# Patient Record
Sex: Female | Born: 1935 | Race: Black or African American | Hispanic: No
Health system: Southern US, Community
[De-identification: ages and names within clinical notes are randomized; demographics above are authoritative.]

## PROBLEM LIST (undated history)

## (undated) DIAGNOSIS — F0390 Unspecified dementia without behavioral disturbance: Secondary | ICD-10-CM

---

## 2017-04-10 DIAGNOSIS — R600 Localized edema: Secondary | ICD-10-CM | POA: Diagnosis not present

## 2017-04-10 DIAGNOSIS — I10 Essential (primary) hypertension: Secondary | ICD-10-CM | POA: Diagnosis not present

## 2017-04-10 DIAGNOSIS — E785 Hyperlipidemia, unspecified: Secondary | ICD-10-CM | POA: Diagnosis not present

## 2017-04-10 DIAGNOSIS — I214 Non-ST elevation (NSTEMI) myocardial infarction: Secondary | ICD-10-CM | POA: Diagnosis not present

## 2017-04-10 DIAGNOSIS — E876 Hypokalemia: Secondary | ICD-10-CM | POA: Diagnosis not present

## 2017-04-10 DIAGNOSIS — E279 Disorder of adrenal gland, unspecified: Secondary | ICD-10-CM

## 2017-04-10 DIAGNOSIS — H409 Unspecified glaucoma: Secondary | ICD-10-CM | POA: Diagnosis not present

## 2017-04-11 DIAGNOSIS — H409 Unspecified glaucoma: Secondary | ICD-10-CM | POA: Diagnosis not present

## 2017-04-11 DIAGNOSIS — E876 Hypokalemia: Secondary | ICD-10-CM | POA: Diagnosis not present

## 2017-04-11 DIAGNOSIS — I10 Essential (primary) hypertension: Secondary | ICD-10-CM | POA: Diagnosis not present

## 2017-04-11 DIAGNOSIS — R600 Localized edema: Secondary | ICD-10-CM | POA: Diagnosis not present

## 2017-04-11 DIAGNOSIS — I214 Non-ST elevation (NSTEMI) myocardial infarction: Secondary | ICD-10-CM | POA: Diagnosis not present

## 2017-04-11 DIAGNOSIS — E785 Hyperlipidemia, unspecified: Secondary | ICD-10-CM

## 2017-04-11 DIAGNOSIS — E279 Disorder of adrenal gland, unspecified: Secondary | ICD-10-CM | POA: Diagnosis not present

## 2017-10-14 ENCOUNTER — Other Ambulatory Visit: Payer: Self-pay

## 2017-10-14 ENCOUNTER — Emergency Department (HOSPITAL_COMMUNITY): Payer: Medicare Other

## 2017-10-14 ENCOUNTER — Emergency Department (HOSPITAL_COMMUNITY)
Admission: EM | Admit: 2017-10-14 | Discharge: 2017-10-18 | Payer: Medicare Other | Attending: Emergency Medicine | Admitting: Emergency Medicine

## 2017-10-14 ENCOUNTER — Encounter (HOSPITAL_COMMUNITY): Payer: Self-pay

## 2017-10-14 ENCOUNTER — Ambulatory Visit (HOSPITAL_COMMUNITY)
Admission: RE | Admit: 2017-10-14 | Discharge: 2017-10-14 | Disposition: A | Discharge status: Home / Self Care | Payer: Medicare Other | Source: Home / Self Care | Attending: Psychiatry | Admitting: Psychiatry

## 2017-10-14 DIAGNOSIS — Z79899 Other long term (current) drug therapy: Secondary | ICD-10-CM | POA: Insufficient documentation

## 2017-10-14 DIAGNOSIS — F0391 Unspecified dementia with behavioral disturbance: Secondary | ICD-10-CM | POA: Insufficient documentation

## 2017-10-14 DIAGNOSIS — G3184 Mild cognitive impairment, so stated: Secondary | ICD-10-CM

## 2017-10-14 DIAGNOSIS — R4689 Other symptoms and signs involving appearance and behavior: Secondary | ICD-10-CM

## 2017-10-14 DIAGNOSIS — R4182 Altered mental status, unspecified: Secondary | ICD-10-CM | POA: Diagnosis present

## 2017-10-14 DIAGNOSIS — F03918 Unspecified dementia, unspecified severity, with other behavioral disturbance: Secondary | ICD-10-CM | POA: Diagnosis present

## 2017-10-14 DIAGNOSIS — R456 Violent behavior: Secondary | ICD-10-CM | POA: Insufficient documentation

## 2017-10-14 DIAGNOSIS — Z008 Encounter for other general examination: Secondary | ICD-10-CM

## 2017-10-14 DIAGNOSIS — R4587 Impulsiveness: Secondary | ICD-10-CM | POA: Diagnosis not present

## 2017-10-14 DIAGNOSIS — R410 Disorientation, unspecified: Secondary | ICD-10-CM

## 2017-10-14 DIAGNOSIS — Z9114 Patient's other noncompliance with medication regimen: Secondary | ICD-10-CM | POA: Diagnosis not present

## 2017-10-14 HISTORY — DX: Unspecified dementia, unspecified severity, without behavioral disturbance, psychotic disturbance, mood disturbance, and anxiety: F03.90

## 2017-10-14 LAB — URINALYSIS, ROUTINE W REFLEX MICROSCOPIC
BILIRUBIN URINE: NEGATIVE
Glucose, UA: NEGATIVE mg/dL
KETONES UR: NEGATIVE mg/dL
LEUKOCYTES UA: NEGATIVE
Nitrite: NEGATIVE
PROTEIN: NEGATIVE mg/dL
Specific Gravity, Urine: 1.015 (ref 1.005–1.030)
pH: 7 (ref 5.0–8.0)

## 2017-10-14 LAB — COMPREHENSIVE METABOLIC PANEL
ALBUMIN: 3.5 g/dL (ref 3.5–5.0)
ALK PHOS: 94 U/L (ref 38–126)
ALT: 17 U/L (ref 14–54)
ANION GAP: 7 (ref 5–15)
AST: 24 U/L (ref 15–41)
BUN: 9 mg/dL (ref 6–20)
CALCIUM: 8.8 mg/dL — AB (ref 8.9–10.3)
CO2: 31 mmol/L (ref 22–32)
Chloride: 106 mmol/L (ref 101–111)
Creatinine, Ser: 1.01 mg/dL — ABNORMAL HIGH (ref 0.44–1.00)
GFR calc Af Amer: 54 mL/min — ABNORMAL LOW (ref >60)
GFR calc non Af Amer: 47 mL/min — ABNORMAL LOW (ref >60)
GLUCOSE: 109 mg/dL — AB (ref 65–99)
Potassium: 3.7 mmol/L (ref 3.5–5.1)
Sodium: 144 mmol/L (ref 135–145)
Total Bilirubin: 0.6 mg/dL (ref 0.3–1.2)
Total Protein: 6.8 g/dL (ref 6.5–8.1)

## 2017-10-14 LAB — CBC WITH DIFFERENTIAL/PLATELET
BASOS ABS: 0 10*3/uL (ref 0.0–0.1)
BASOS PCT: 0 %
Eosinophils Absolute: 0.2 10*3/uL (ref 0.0–0.7)
Eosinophils Relative: 3 %
HCT: 36 % (ref 36.0–46.0)
HEMOGLOBIN: 10.8 g/dL — AB (ref 12.0–15.0)
Lymphocytes Relative: 22 %
Lymphs Abs: 1.3 10*3/uL (ref 0.7–4.0)
MCH: 25.9 pg — ABNORMAL LOW (ref 26.0–34.0)
MCHC: 30 g/dL (ref 30.0–36.0)
MCV: 86.3 fL (ref 78.0–100.0)
Monocytes Absolute: 0.6 10*3/uL (ref 0.1–1.0)
Monocytes Relative: 11 %
NEUTROS PCT: 64 %
Neutro Abs: 3.7 10*3/uL (ref 1.7–7.7)
Platelets: 185 10*3/uL (ref 150–400)
RBC: 4.17 MIL/uL (ref 3.87–5.11)
RDW: 16.8 % — ABNORMAL HIGH (ref 11.5–15.5)
WBC: 5.8 10*3/uL (ref 4.0–10.5)

## 2017-10-14 MED ORDER — CARVEDILOL 6.25 MG PO TABS
6.2500 mg | ORAL_TABLET | Freq: Two times a day (BID) | ORAL | Status: DC
  Administered 2017-10-15 – 2017-10-18 (×3): 6.25 mg via ORAL
  Filled 2017-10-14 (×9): qty 1

## 2017-10-14 MED ORDER — AMLODIPINE BESYLATE 5 MG PO TABS
10.0000 mg | ORAL_TABLET | Freq: Every day | ORAL | Status: DC
  Administered 2017-10-15 – 2017-10-18 (×3): 10 mg via ORAL
  Filled 2017-10-14 (×4): qty 2

## 2017-10-14 MED ORDER — MEMANTINE HCL 10 MG PO TABS
10.0000 mg | ORAL_TABLET | Freq: Two times a day (BID) | ORAL | Status: DC
  Administered 2017-10-15 – 2017-10-18 (×6): 10 mg via ORAL
  Filled 2017-10-14 (×10): qty 1

## 2017-10-14 MED ORDER — DOCUSATE SODIUM 100 MG PO CAPS
100.0000 mg | ORAL_CAPSULE | Freq: Two times a day (BID) | ORAL | Status: DC
  Administered 2017-10-15 – 2017-10-18 (×6): 100 mg via ORAL
  Filled 2017-10-14 (×7): qty 1

## 2017-10-14 MED ORDER — QUETIAPINE FUMARATE ER 200 MG PO TB24
200.0000 mg | ORAL_TABLET | Freq: Every day | ORAL | Status: DC
  Administered 2017-10-15 – 2017-10-18 (×3): 200 mg via ORAL
  Filled 2017-10-14 (×4): qty 1

## 2017-10-14 MED ORDER — CLONIDINE HCL 0.1 MG/24HR TD PTWK
0.1000 mg | MEDICATED_PATCH | TRANSDERMAL | Status: DC
  Administered 2017-10-15: 0.1 mg via TRANSDERMAL
  Filled 2017-10-14 (×2): qty 1

## 2017-10-14 MED ORDER — ACETAMINOPHEN 500 MG PO TABS: 1000.0000 mg | ORAL_TABLET | Freq: Four times a day (QID) | ORAL | Status: DC | PRN

## 2017-10-14 MED ORDER — VITAMIN D (ERGOCALCIFEROL) 1.25 MG (50000 UNIT) PO CAPS
50000.0000 [IU] | ORAL_CAPSULE | ORAL | Status: DC
  Filled 2017-10-14 (×2): qty 1

## 2017-10-14 MED ORDER — ENSURE ENLIVE PO LIQD
237.0000 mL | Freq: Every day | ORAL | Status: DC
  Administered 2017-10-15 – 2017-10-18 (×3): 237 mL via ORAL
  Filled 2017-10-14 (×5): qty 237

## 2017-10-14 MED ORDER — LORATADINE 10 MG PO TABS
10.0000 mg | ORAL_TABLET | Freq: Every day | ORAL | Status: DC
  Administered 2017-10-15 – 2017-10-18 (×3): 10 mg via ORAL
  Filled 2017-10-14 (×4): qty 1

## 2017-10-14 MED ORDER — ALUM & MAG HYDROXIDE-SIMETH 200-200-20 MG/5ML PO SUSP
30.0000 mL | Freq: Every day | ORAL | Status: DC | PRN
  Administered 2017-10-15 – 2017-10-18 (×2): 30 mL via ORAL
  Filled 2017-10-14 (×2): qty 30

## 2017-10-14 MED ORDER — ASPIRIN EC 81 MG PO TBEC
81.0000 mg | DELAYED_RELEASE_TABLET | Freq: Every day | ORAL | Status: DC
  Administered 2017-10-15 – 2017-10-18 (×3): 81 mg via ORAL
  Filled 2017-10-14 (×4): qty 1

## 2017-10-14 MED ORDER — MAGNESIUM HYDROXIDE 400 MG/5ML PO SUSP
30.0000 mL | Freq: Every day | ORAL | Status: DC | PRN
  Filled 2017-10-14: qty 30

## 2017-10-14 MED ORDER — HYDRALAZINE HCL 50 MG PO TABS
50.0000 mg | ORAL_TABLET | Freq: Three times a day (TID) | ORAL | Status: DC
  Administered 2017-10-15 – 2017-10-18 (×5): 50 mg via ORAL
  Filled 2017-10-14 (×13): qty 1

## 2017-10-14 MED ORDER — MIRTAZAPINE 7.5 MG PO TABS
7.5000 mg | ORAL_TABLET | Freq: Every day | ORAL | Status: DC
  Administered 2017-10-15 – 2017-10-17 (×3): 7.5 mg via ORAL
  Filled 2017-10-14 (×5): qty 1

## 2017-10-14 MED ORDER — PANTOPRAZOLE SODIUM 40 MG PO TBEC
40.0000 mg | DELAYED_RELEASE_TABLET | Freq: Every day | ORAL | Status: DC
  Administered 2017-10-15 – 2017-10-18 (×3): 40 mg via ORAL
  Filled 2017-10-14 (×4): qty 1

## 2017-10-14 MED ORDER — TRAZODONE HCL 50 MG PO TABS
12.5000 mg | ORAL_TABLET | Freq: Two times a day (BID) | ORAL | Status: DC
  Administered 2017-10-15 – 2017-10-18 (×5): 25 mg via ORAL
  Filled 2017-10-14 (×6): qty 1

## 2017-10-14 MED ORDER — GUAIFENESIN 100 MG/5ML PO SOLN: 200.0000 mg | Freq: Four times a day (QID) | ORAL | Status: DC | PRN

## 2017-10-14 MED ORDER — ATORVASTATIN CALCIUM 20 MG PO TABS
20.0000 mg | ORAL_TABLET | Freq: Every day | ORAL | Status: DC
  Administered 2017-10-15 – 2017-10-17 (×3): 20 mg via ORAL
  Filled 2017-10-14 (×5): qty 1

## 2017-10-14 MED ORDER — LORAZEPAM 2 MG/ML IJ SOLN
0.5000 mg | Freq: Once | INTRAMUSCULAR | Status: AC
  Administered 2017-10-14: 0.5 mg via INTRAMUSCULAR
  Filled 2017-10-14: qty 1

## 2017-10-14 NOTE — ED Triage Notes (Signed)
Pt BIB son. He reports that she is more aggressive and confused than normal. Pt is trying to leave and keeps stating that she is "strong" and needs to go to her doctor in [place]. Son is concerned because pt was recently dx'd with UTI and on antibiotics, but is becoming more combative. She lives at [facility]. Hx of alzheimers, dementia, and anxiety. She is trying to leave and not cooperative during triage.

## 2017-10-14 NOTE — ED Notes (Signed)
Pt aware urine sample is needed 

## 2017-10-14 NOTE — ED Provider Notes (Signed)
[facility]-EMERGENCY DEPT Provider Note   CSN: [id number] Arrival date & time: 10/14/17  1715     History   Chief Complaint Chief Complaint  Patient presents with  . Altered Mental Status    Dementia    HPI Meagan Gomez is a 84 y.o. female.  The history is provided by the patient, medical records and a relative. No language interpreter was used.  Altered Mental Status     Meagan Gomez is a 84 y.o. female  with a PMH of dementia who presents to the Emergency Department for evaluation of altered mental status.  Per son, patient does have confusion at baseline due to her dementia, but has been much more confused recently and exhibiting aggressive behavior.  Facility sent patient to [facility] yesterday.  At that time, family did not want any further lab work or imaging done as they believed her behavior changes were natural progression of her dementia.  She went back to facility and subsequently saw behavioral health today.  Nurse practitioner at behavioral health facility recommended patient be sent over to emergency department, recommending inpatient treatment.  Her son, who is power of attorney, patient has not been taking any of her medications at facility either.  She is just refusing.  To me, patient reports that she is "strong and Jesus is taking care of her".  Denies any complaints.  Son reported patient was on antibiotics, on most recent medication list from facility, only antibiotics listed were Cipro and Flagyl which were started on 1/31.  No new antibiotics listed.  He does not know any further details about this.   Level V caveat applies 2/2 dementia: Majority of history obtained from son.  Past Medical History:  Diagnosis Date  . Dementia     There are no active problems to display for this patient.   History reviewed. No pertinent surgical history.  OB History    No data available       Home Medications    Prior to  Admission medications   Medication Sig Start Date End Date Taking? Authorizing Provider  acetaminophen (TYLENOL) 500 MG tablet Take 1,000 mg by mouth every 6 (six) hours as needed for mild pain or moderate pain.   Yes [provider]  alum & mag hydroxide-simeth (MINTOX PLUS) 200-200-25 MG chewable tablet Chew 1 tablet by mouth daily as needed for indigestion.   Yes [provider]  amLODipine (NORVASC) 10 MG tablet Take 10 mg by mouth daily.   Yes [provider]  aspirin EC 81 MG tablet Take 81 mg by mouth daily.   Yes [provider]  atorvastatin (LIPITOR) 20 MG tablet Take 20 mg by mouth at bedtime.   Yes [provider]  carvedilol (COREG) 6.25 MG tablet Take 6.25 mg by mouth 2 (two) times daily with a meal.   Yes [provider]  cloNIDine (CATAPRES - DOSED IN MG/24 HR) 0.1 mg/24hr patch Place 0.1 mg onto the skin once a week.   Yes [provider]  cyanocobalamin (,VITAMIN B-12,) 1000 MCG/ML injection Inject 1,000 mcg into the muscle every 30 (thirty) days. 10/12/17  Yes [provider]  docusate sodium (COLACE) 100 MG capsule Take 100 mg by mouth 2 (two) times daily.   Yes [provider]  ENSURE (ENSURE) Take 237 mLs by mouth daily.   Yes [provider]  ergocalciferol (VITAMIN D2) 50000 units capsule Take 50,000 Units by mouth once a week.  Yes [provider]  guaiFENesin (ROBITUSSIN) 100 MG/5ML liquid Take 200 mg by mouth 4 (four) times daily as needed for cough.   Yes [provider]  hydrALAZINE (APRESOLINE) 50 MG tablet Take 50 mg by mouth 3 (three) times daily.   Yes [provider]  loratadine (CLARITIN) 10 MG tablet Take 10 mg by mouth daily.   Yes [provider]  magnesium hydroxide (MILK OF MAGNESIA) 400 MG/5ML suspension Take 30 mLs by mouth daily as needed for mild constipation.   Yes [provider]  memantine (NAMENDA) 10 MG tablet Take 10  mg by mouth 2 (two) times daily.   Yes [provider]  mirtazapine (REMERON) 7.5 MG tablet Take 7.5 mg by mouth at bedtime.   Yes [provider]  pantoprazole (PROTONIX) 40 MG tablet Take 40 mg by mouth daily.   Yes [provider]  PRESCRIPTION MEDICATION Apply 0.5 mg topically every 12 (twelve) hours as needed (agitation). 1mg / ml lorazepam gel, apply topically to neck and wrist   Yes [provider]  QUEtiapine (SEROQUEL XR) 200 MG 24 hr tablet Take 200 mg by mouth daily.   Yes [provider]  traZODone (DESYREL) 50 MG tablet Take 12.5 mg by mouth 2 (two) times daily.   Yes [provider]    Family History History reviewed. No pertinent family history.  Social History Social History   Tobacco Use  . Smoking status: Not on file  Substance Use Topics  . Alcohol use: Not on file  . Drug use: Not on file     Allergies   Aspirin and Penicillins   Review of Systems Review of Systems  Unable to perform ROS: Dementia     Physical Exam Updated Vital Signs BP (!) 136/55 (BP Location: Left Arm)   Pulse 80   Temp 98.2 F (36.8 C) (Oral)   Resp 16   SpO2 96%   Physical Exam  Constitutional: She appears well-developed and well-nourished. No distress.  HENT:  Head: Normocephalic and atraumatic.  Cardiovascular: Normal heart sounds.  No murmur heard. Tachycardic but regular.  Pulmonary/Chest: Effort normal and breath sounds normal. No respiratory distress.  Abdominal: Soft. Bowel sounds are normal. She exhibits no distension.  No abdominal or CVA tenderness.  Musculoskeletal: Normal range of motion.  Neurological:  Alert. Agitated. Oriented to person, time. Will follow commands. Moves all extremities independently.   Skin: Skin is warm and dry.  Nursing note and vitals reviewed.    ED Treatments / Results  Labs (all labs ordered are listed, but only abnormal results are displayed) Labs Reviewed  URINALYSIS,  ROUTINE W REFLEX MICROSCOPIC - Abnormal; Notable for the following components:      Result Value   Hgb urine dipstick SMALL (*)    Bacteria, UA RARE (*)    Squamous Epithelial / LPF 0-5 (*)    All other components within normal limits  CBC WITH DIFFERENTIAL/PLATELET - Abnormal; Notable for the following components:   Hemoglobin 10.8 (*)    MCH 25.9 (*)    RDW 16.8 (*)    All other components within normal limits  COMPREHENSIVE METABOLIC PANEL - Abnormal; Notable for the following components:   Glucose, Bld 109 (*)    Creatinine, Ser 1.01 (*)    Calcium 8.8 (*)    GFR calc non Af Amer 47 (*)    GFR calc Af Amer 54 (*)    All other components within normal limits  URINE CULTURE  EKG  EKG Interpretation  Date/Time:  Friday October 14 2017 18:44:18 EST Ventricular Rate:  107 PR Interval:    QRS Duration: 81 QT Interval:  335 QTC Calculation: 447 R Axis:   -32 Text Interpretation:  Sinus tachycardia Probable left atrial enlargement Left ventricular hypertrophy Confirmed by Loren RacerYelverton, David ([id number]) on 10/14/2017 10:27:14 PM       Radiology Ct Head Wo Contrast  Result Date: 10/14/2017 CLINICAL DATA:  Increased aggression and confusion EXAM: CT HEAD WITHOUT CONTRAST TECHNIQUE: Contiguous axial images were obtained from the base of the skull through the vertex without intravenous contrast. COMPARISON:  CT brain 09/20/2017 FINDINGS: Brain: No acute territorial infarction, hemorrhage, or intracranial mass is visualized. Mild to moderate atrophy. Moderate small vessel ischemic changes of the white matter. Stable ventricle size. Vascular: No hyperdense vessels.  Carotid vascular calcification Skull: Normal. Negative for fracture or focal lesion. Sinuses/Orbits: No acute finding. Other: None IMPRESSION: 1. No CT evidence for acute intracranial abnormality. 2. Atrophy and small vessel ischemic changes of the white matter Electronically Signed   By: Jasmine PangKim  Fujinaga M.D.   On: 10/14/2017 19:36     Procedures Procedures (including critical care time)  Medications Ordered in ED Medications  acetaminophen (TYLENOL) tablet 1,000 mg (not administered)  alum & mag hydroxide-simeth (GELUSIL) 200-200-25 MG per chewable tablet 1 tablet (not administered)  amLODipine (NORVASC) tablet 10 mg (not administered)  aspirin EC tablet 81 mg (not administered)  atorvastatin (LIPITOR) tablet 20 mg (not administered)  carvedilol (COREG) tablet 6.25 mg (not administered)  cloNIDine (CATAPRES - Dosed in mg/24 hr) patch 0.1 mg (not administered)  docusate sodium (COLACE) capsule 100 mg (not administered)  ENSURE liquid 237 mL (not administered)  ergocalciferol (VITAMIN D2) capsule 50,000 Units (not administered)  guaiFENesin (ROBITUSSIN) 100 MG/5ML liquid 200 mg (not administered)  hydrALAZINE (APRESOLINE) tablet 50 mg (not administered)  loratadine (CLARITIN) tablet 10 mg (not administered)  magnesium hydroxide (MILK OF MAGNESIA) suspension 30 mL (not administered)  memantine (NAMENDA) tablet 10 mg (not administered)  mirtazapine (REMERON) tablet 7.5 mg (not administered)  pantoprazole (PROTONIX) EC tablet 40 mg (not administered)  QUEtiapine (SEROQUEL XR) 24 hr tablet 200 mg (not administered)  traZODone (DESYREL) tablet 25 mg (not administered)  LORazepam (ATIVAN) injection 0.5 mg (0.5 mg Intramuscular Given 10/14/17 1847)     Initial Impression / Assessment and Plan / ED Course  I have reviewed the triage vital signs and the nursing notes.  Pertinent labs & imaging results that were available during my care of the patient were reviewed by me and considered in my medical decision making (see chart for details).    Meagan Gomez is a 84 y.o. female who presents to ED for increased confusion and aggressive behavior over the last several days.  History of dementia and at nursing facility.  CT head negative.  Labs reviewed and reassuring.  UA with no signs of infection.  Has already been  seen by outpatient behavioral health today, recommending inpatient placement.  Medically cleared.   Patient discussed with Dr. Ranae PalmsYelverton who agrees with treatment plan.    Final Clinical Impressions(s) / ED Diagnoses   Final diagnoses:  Confusion  Aggressive behavior    ED Discharge Orders    None       Faizan Geraci, Chase PicketJaime Pilcher, PA-C 10/14/17 2245    Loren RacerYelverton, David, MD 10/14/17 2246

## 2017-10-14 NOTE — BH Assessment (Signed)
Assessment Note  Meagan Gomez is an 84 y.o. female present to [facility] with her daughter and son. Per family report patient resides at [facility], a community living residential facility. The family expressed concern of patient not taking her medication for the past 5 days. Patient has been diagnosed with Dementia for the past 10 years. Patient receives medication management services at [facility]. Patient as been agitated and irritable. No report of SI, HI, or AVH.   Collateral information: [place], [facility] with [facility]  Per [place] patient has refused to take her medication. Patient as been agitated and irritable. Patient has been Merchandiser, retail. Per report yesterday patient walked out the facility (staff followed and monitored) and refused to return for 30 minutes. [facility] report they referred the patient to [facility]. When they called the hospital to check on patient they learned patient was signed out by her family. Report the facility did not know what was going on. Calls to reach family was unresponsive as the family did not answer the phone nor returned calls to [place].   Per Shuvon Rankin, NP, patient recommended for inpt treatment   Diagnosis: G31.84   Mild neurocognitive disorder due to Alzheimer's disease   Past Medical History: No past medical history on file.  Social History:  has no tobacco, alcohol, and drug history on file.  Additional Social History:  Alcohol / Drug Use Pain Medications: see MAR Prescriptions: see MAR Over the Counter: see MAR History of alcohol / drug use?: No history of alcohol / drug abuse  CIWA:   COWS:    Allergies: Allergies not on file  Home Medications:  (Not in a hospital admission)  OB/GYN Status:  No LMP recorded.  General Assessment Data TTS Assessment: In system Is this a Tele or Face-to-Face Assessment?: Face-to-Face Is this an Initial Assessment or a Re-assessment for this encounter?: Initial  Assessment Marital status: Widowed Living Arrangements: Other (Comment)([facility] ) Can pt return to current living arrangement?: Yes Admission Status: Voluntary Is patient capable of signing voluntary admission?: No Referral Source: Self/Family/Friend Insurance type: Medicare  Medical Screening Exam [facility] Walk-in ONLY) Medical Exam completed: Yes  Crisis Care Plan Living Arrangements: Other (Comment)([facility] ) Name of Psychiatrist: [facility]  Education Status Is patient currently in school?: No  Risk to self with the past 6 months Suicidal Ideation: No Has patient been a risk to self within the past 6 months prior to admission? : No Suicidal Intent: No Has patient had any suicidal intent within the past 6 months prior to admission? : No Is patient at risk for suicide?: No Suicidal Plan?: No Has patient had any suicidal plan within the past 6 months prior to admission? : No Access to Means: No What has been your use of drugs/alcohol within the last 12 months?: none report Previous Attempts/Gestures: No How many times?: 0 Other Self Harm Risks: none report Family Suicide History: No Persecutory voices/beliefs?: No Substance abuse history and/or treatment for substance abuse?: No Suicide prevention information given to non-admitted patients: Not applicable  Risk to Others within the past 6 months Homicidal Ideation: No Does patient have any lifetime risk of violence toward others beyond the six months prior to admission? : No Thoughts of Harm to Others: No Current Homicidal Intent: No Current Homicidal Plan: No Access to Homicidal Means: No Identified Victim: n/a History of harm to others?: No Assessment of Violence: None Noted Violent Behavior Description: none noted Does patient have  access to weapons?: No Criminal Charges Pending?: No Does patient have a court date: No Is patient on probation?:  No  Psychosis Hallucinations: None noted Delusions: None noted  Mental Status Report Appearance/Hygiene: Layered clothes Eye Contact: Fair Motor Activity: Freedom of movement(uses a walker to walk) Speech: Logical/coherent Level of Consciousness: Irritable, Restless Mood: Irritable Affect: Irritable Anxiety Level: None Thought Processes: Unable to Assess Judgement: Unable to Assess Orientation: Unable to assess Obsessive Compulsive Thoughts/Behaviors: Unable to Assess  Cognitive Functioning Concentration: Poor Memory: Unable to Assess Insight: Unable to Assess Impulse Control: Unable to Assess Appetite: Poor Sleep: Unable to Assess  ADLScreening [facility] Assessment Services) Patient's cognitive ability adequate to safely complete daily activities?: No Patient able to express need for assistance with ADLs?: Yes Independently performs ADLs?: No  Prior Inpatient Therapy Prior Inpatient Therapy: No  Prior Outpatient Therapy Prior Outpatient Therapy: No Does patient have an ACCT team?: No Does patient have Intensive In-House Services?  : No Does patient have [facility] services? : No Does patient have P4CC services?: No  ADL Screening (condition at time of admission) Patient's cognitive ability adequate to safely complete daily activities?: No Is the patient deaf or have difficulty hearing?: No Does the patient have difficulty seeing, even when wearing glasses/contacts?: No Does the patient have difficulty concentrating, remembering, or making decisions?: Yes Patient able to express need for assistance with ADLs?: Yes Does the patient have difficulty dressing or bathing?: Yes Independently performs ADLs?: No Does the patient have difficulty walking or climbing stairs?: Yes(use a walker)       Abuse/Neglect Assessment (Assessment to be complete while patient is alone) Abuse/Neglect Assessment Can Be Completed: Yes Physical Abuse: Denies Verbal Abuse: Denies Sexual  Abuse: Denies Exploitation of patient/patient's resources: Denies Self-Neglect: Denies     Merchant navy officerAdvance Directives (For Healthcare) Does Patient Have a Medical Advance Directive?: No Would patient like information on creating a medical advance directive?: No - Patient declined    Additional Information 1:1 In Past 12 Months?: No CIRT Risk: No Elopement Risk: No Does patient have medical clearance?: No     Disposition:  Disposition Initial Assessment Completed for this Encounter: Yes Disposition of Patient: Pending Review with psychiatrist  On Site Evaluation by:   Reviewed with Physician:    Dian Situelvondria Andry Bogden 10/14/2017 3:54 PM

## 2017-10-14 NOTE — ED Notes (Signed)
Bed: WA04 Expected date:  Expected time:  Means of arrival:  Comments: Hold for TCU 30

## 2017-10-14 NOTE — H&P (Addendum)
Behavioral Health Medical Screening Exam  Meagan Gomez is an 84 y.o. female patient presents as walk in at [facility]; brought in by her family (son and granddaughter) ; with complaints that patient has been refusing her medication because she thinks the facility is trying to kill her.  Patient states that the medication is making her feel sick and she is not going to take it.  Patient has history of Dementia but family states that irritation and paranoia is new and unsure why the change.  Patient is calm and cooperative until ask about her medications or if she feels she is going to stay in hospital.  Family also states that patient has been talking a lot about going to her father's house (referring to heaven) and that she will be well again once she gets there.    Total Time spent with patient: 1 hour  Psychiatric Specialty Exam: Physical Exam  HENT:  Head: Normocephalic.  Neck: Normal range of motion.  Respiratory: Effort normal.  Musculoskeletal: She exhibits edema (Bilateral lower ext).  Uses a walker for ambulation   Neurological: She is alert.  Oriented to self; knows she is in hospital; but not where    Skin: Skin is warm and dry.  Psychiatric: Her mood appears anxious. She is agitated. Thought content is paranoid. She expresses impulsivity. She exhibits abnormal recent memory and abnormal remote memory.    Review of Systems  HENT:       History of glaucoma; not taking eye drops; family not sure if patient is suppose to be taking drops; complaints of blurred vision.    Cardiovascular: Positive for leg swelling. Negative for chest pain and palpitations.       History of heart attack August/2018   Gastrointestinal: Positive for constipation (Worsening constapation and recent fecal impaction that was removed by he grand daughter who is also a Charity fundraiser).       Recent GI infection January; was treated by PCP  Psychiatric/Behavioral: The patient is nervous/anxious.        History of  Dementia; paranoia for last week or more; has been refusing to take her medications because feels the facility is trying to poison her.  Reports that medication is making her feel worse. Patient is taking Trazodone bid, Seroquel 200 mg in morning; and Remeron 7.5 mg in morning; along with several cardiac meds during the day which may be why complaints of fatigue, and not feeling well.      There were no vitals taken for this visit.There is no height or weight on file to calculate BMI.  General Appearance: Casual  Eye Contact:  Fair  Speech:  Clear and Coherent and Normal Rate  Volume:  Normal  Mood:  Anxious and Irritable  Affect:  Appropriate  Thought Process:  Linear  Orientation:  Other:  Oriented person; place  Thought Content:  Paranoid Ideation  Suicidal Thoughts:  No  Homicidal Thoughts:  No  Memory:  Immediate;   Poor Recent;   Poor Remote;   Poor  Judgement:  Impaired  Insight:  Fair  Psychomotor Activity:  Decreased  Concentration: Concentration: Good and Attention Span: Good  Recall:  Poor  Fund of Knowledge:Fair  Language: Good  Akathisia:  No  Handed:  Right  AIMS (if indicated):     Assets:  Communication Skills Resilience Social Support  Sleep:       Musculoskeletal: Strength & Muscle Tone: within normal limits Gait & Station: Uses rolling walker for ambulation Patient  leans: N/A  There were no vitals taken for this visit.  Patient refused to let do vital signs  Consulted with Dr. Jama Flavorsobos   Recommendations:  Inpatient psychiatric treatment  Based on my evaluation the patient appears to have an emergency medical condition for which I recommend the patient be transferred to the emergency department for further evaluation.     Shuvon Rankin, NP 10/14/2017, 4:34 PM   Agree with NP Assessment

## 2017-10-15 DIAGNOSIS — R4182 Altered mental status, unspecified: Secondary | ICD-10-CM | POA: Diagnosis not present

## 2017-10-15 NOTE — Consult Note (Addendum)
[facility] Consult   Reason for Consult:  Dementia  Referring Physician:  EDP Patient Identification: Meagan Gomez MRN:  [id number] Principal Diagnosis: <principal problem not specified> Diagnosis:  There are no active problems to display for this patient.   Total Time spent with patient: 30 minutes  HPI:   Meagan Gomez is a 84 y.o. female patient who presents to the ED escorted by her family after initially presenting to [facility].   Family has complaints that patient had been refusing her medication because she thinks that her long term care facility is trying to poison her with her medication. This morning, patient confirms that the medication she has been taking has been making her feel sick and she is not going to take it. Patient calls her recent medications poison.  Patient has history of Dementia. Patient family noted on admission that her irritation, recent aggressiveness, and paranoia are new and are unsure why she has had this change.  Patient is calm and cooperative and feels that she is still in her nursing home in [place].  Patient denies any suicidal ideation, thoughts of hurting herself or others and feels safe at home. Patient denies any other concerns. Pt would benefit from an inpatient geriatric psychiatric admission for medication management and crisis stabilization.   Past Psychiatric History:  Dementia  Risk to Self: Is patient at risk for suicide?: No Risk to Others:  None Prior Inpatient Therapy:   Prior Outpatient Therapy:    Past Medical History:  Past Medical History:  Diagnosis Date  . Dementia    History reviewed. No pertinent surgical history. Family History: History reviewed. No pertinent family history.  Social History:  Social History   Substance and Sexual Activity  Alcohol Use Not on file     Social History   Substance and Sexual Activity  Drug Use Not on file    Social History   Socioeconomic History  . Marital  status: Widowed    Spouse name: None  . Number of children: None  . Years of education: None  . Highest education level: None  Social Needs  . Financial resource strain: None  . Food insecurity - worry: None  . Food insecurity - inability: None  . Transportation needs - medical: None  . Transportation needs - non-medical: None  Occupational History  . None  Tobacco Use  . Smoking status: None  Substance and Sexual Activity  . Alcohol use: None  . Drug use: None  . Sexual activity: None  Other Topics Concern  . None  Social History Narrative  . None    Allergies:   Allergies  Allergen Reactions  . Aspirin Other (See Comments)    Unknown Reaction, on MAR  . Penicillins Other (See Comments)    Unknown reaction, on MAR    Labs:  Results for orders placed or performed during the hospital encounter of 10/14/17 (from the past 48 hour(s))  CBC with Differential     Status: Abnormal   Collection Time: 10/14/17  7:36 PM  Result Value Ref Range   WBC 5.8 4.0 - 10.5 K/uL   RBC 4.17 3.87 - 5.11 MIL/uL   Hemoglobin 10.8 (L) 12.0 - 15.0 g/dL   HCT 36.0 36.0 - 46.0 %   MCV 86.3 78.0 - 100.0 fL   MCH 25.9 (L) 26.0 - 34.0 pg   MCHC 30.0 30.0 - 36.0 g/dL   RDW 16.8 (H) 11.5 - 15.5 %   Platelets 185 150 -  400 K/uL   Neutrophils Relative % 64 %   Neutro Abs 3.7 1.7 - 7.7 K/uL   Lymphocytes Relative 22 %   Lymphs Abs 1.3 0.7 - 4.0 K/uL   Monocytes Relative 11 %   Monocytes Absolute 0.6 0.1 - 1.0 K/uL   Eosinophils Relative 3 %   Eosinophils Absolute 0.2 0.0 - 0.7 K/uL   Basophils Relative 0 %   Basophils Absolute 0.0 0.0 - 0.1 K/uL    Comment: Performed at [facility], [place] [address] [place], [place] [address]  Comprehensive metabolic panel     Status: Abnormal   Collection Time: 10/14/17  7:36 PM  Result Value Ref Range   Sodium 144 135 - 145 mmol/L   Potassium 3.7 3.5 - 5.1 mmol/L   Chloride 106 101 - 111 mmol/L   CO2 31 22 - 32 mmol/L   Glucose, Bld  109 (H) 65 - 99 mg/dL   BUN 9 6 - 20 mg/dL   Creatinine, Ser 1.01 (H) 0.44 - 1.00 mg/dL   Calcium 8.8 (L) 8.9 - 10.3 mg/dL   Total Protein 6.8 6.5 - 8.1 g/dL   Albumin 3.5 3.5 - 5.0 g/dL   AST 24 15 - 41 U/L   ALT 17 14 - 54 U/L   Alkaline Phosphatase 94 38 - 126 U/L   Total Bilirubin 0.6 0.3 - 1.2 mg/dL   GFR calc non Af Amer 47 (L) >60 mL/min   GFR calc Af Amer 54 (L) >60 mL/min    Comment: (NOTE) The eGFR has been calculated using the CKD EPI equation. This calculation has not been validated in all clinical situations. eGFR's persistently <60 mL/min signify possible Chronic Kidney Disease.    Anion gap 7 5 - 15    Comment: Performed at [facility], [place] [address], [place], [place] [address]  Urinalysis, Routine w reflex microscopic     Status: Abnormal   Collection Time: 10/14/17  9:29 PM  Result Value Ref Range   Color, Urine YELLOW YELLOW   APPearance CLEAR CLEAR   Specific Gravity, Urine 1.015 1.005 - 1.030   pH 7.0 5.0 - 8.0   Glucose, UA NEGATIVE NEGATIVE mg/dL   Hgb urine dipstick SMALL (A) NEGATIVE   Bilirubin Urine NEGATIVE NEGATIVE   Ketones, ur NEGATIVE NEGATIVE mg/dL   Protein, ur NEGATIVE NEGATIVE mg/dL   Nitrite NEGATIVE NEGATIVE   Leukocytes, UA NEGATIVE NEGATIVE   RBC / HPF 0-5 0 - 5 RBC/hpf   WBC, UA 0-5 0 - 5 WBC/hpf   Bacteria, UA RARE (A) NONE SEEN   Squamous Epithelial / LPF 0-5 (A) NONE SEEN   Mucus PRESENT     Comment: Performed at [facility], [place] [address], [place], [place] [address]    Current Facility-Administered Medications  Medication Dose Route Frequency Provider Last Rate Last Dose  . acetaminophen (TYLENOL) tablet 1,000 mg  1,000 mg Oral Q6H PRN Ward, Ozella Almond, PA-C      . alum & mag hydroxide-simeth (MAALOX/MYLANTA) 200-200-20 MG/5ML suspension 30 mL  30 mL Oral Daily PRN Ward, Ozella Almond, PA-C      . amLODipine (NORVASC) tablet 10 mg  10 mg Oral Daily Ward, Ozella Almond, PA-C   10 mg  at 10/15/17 1023  . aspirin EC tablet 81 mg  81 mg Oral Daily Ward, Ozella Almond, PA-C   81 mg at 10/15/17 1023  . atorvastatin (LIPITOR) tablet 20 mg  20 mg Oral QHS Ward,  Ozella Almond, PA-C      . carvedilol (COREG) tablet 6.25 mg  6.25 mg Oral BID WC Ward, Ozella Almond, PA-C      . cloNIDine (CATAPRES - Dosed in mg/24 hr) patch 0.1 mg  0.1 mg Transdermal Weekly Ward, Ozella Almond, PA-C      . docusate sodium (COLACE) capsule 100 mg  100 mg Oral BID Ward, Ozella Almond, PA-C   100 mg at 10/15/17 1024  . feeding supplement (ENSURE ENLIVE) (ENSURE ENLIVE) liquid 237 mL  237 mL Oral Daily Ward, Ozella Almond, PA-C      . guaiFENesin (ROBITUSSIN) 100 MG/5ML solution 200 mg  200 mg Oral QID PRN Ward, Ozella Almond, PA-C      . hydrALAZINE (APRESOLINE) tablet 50 mg  50 mg Oral TID Ward, Ozella Almond, PA-C   50 mg at 10/15/17 1025  . loratadine (CLARITIN) tablet 10 mg  10 mg Oral Daily Ward, Ozella Almond, PA-C   10 mg at 10/15/17 1025  . magnesium hydroxide (MILK OF MAGNESIA) suspension 30 mL  30 mL Oral Daily PRN Ward, Ozella Almond, PA-C      . memantine [facility]) tablet 10 mg  10 mg Oral BID Ward, Ozella Almond, PA-C   10 mg at 10/15/17 1025  . mirtazapine (REMERON) tablet 7.5 mg  7.5 mg Oral QHS Ward, Ozella Almond, PA-C      . pantoprazole (PROTONIX) EC tablet 40 mg  40 mg Oral Daily Ward, Ozella Almond, PA-C   40 mg at 10/15/17 1024  . QUEtiapine (SEROQUEL XR) 24 hr tablet 200 mg  200 mg Oral Daily Ward, Ozella Almond, PA-C   200 mg at 10/15/17 1024  . traZODone (DESYREL) tablet 25 mg  25 mg Oral BID Ward, Ozella Almond, PA-C      . Vitamin D (Ergocalciferol) (DRISDOL) capsule 50,000 Units  50,000 Units Oral Weekly Ward, Ozella Almond, PA-C       Current Outpatient Medications  Medication Sig Dispense Refill  . acetaminophen (TYLENOL) 500 MG tablet Take 1,000 mg by mouth every 6 (six) hours as needed for mild pain or moderate pain.    Marland Kitchen alum & mag hydroxide-simeth (MINTOX PLUS) 200-200-25  MG chewable tablet Chew 1 tablet by mouth daily as needed for indigestion.    Marland Kitchen amLODipine (NORVASC) 10 MG tablet Take 10 mg by mouth daily.    Marland Kitchen aspirin EC 81 MG tablet Take 81 mg by mouth daily.    Marland Kitchen atorvastatin (LIPITOR) 20 MG tablet Take 20 mg by mouth at bedtime.    . carvedilol (COREG) 6.25 MG tablet Take 6.25 mg by mouth 2 (two) times daily with a meal.    . cloNIDine (CATAPRES - DOSED IN MG/24 HR) 0.1 mg/24hr patch Place 0.1 mg onto the skin once a week.    . cyanocobalamin (,VITAMIN B-12,) 1000 MCG/ML injection Inject 1,000 mcg into the muscle every 30 (thirty) days.    Marland Kitchen docusate sodium (COLACE) 100 MG capsule Take 100 mg by mouth 2 (two) times daily.    Marland Kitchen ENSURE (ENSURE) Take 237 mLs by mouth daily.    . ergocalciferol (VITAMIN D2) 50000 units capsule Take 50,000 Units by mouth once a week.    Marland Kitchen guaiFENesin (ROBITUSSIN) 100 MG/5ML liquid Take 200 mg by mouth 4 (four) times daily as needed for cough.    . hydrALAZINE (APRESOLINE) 50 MG tablet Take 50 mg by mouth 3 (three) times daily.    Marland Kitchen loratadine (CLARITIN) 10 MG tablet Take 10 mg by  mouth daily.    . magnesium hydroxide (MILK OF MAGNESIA) 400 MG/5ML suspension Take 30 mLs by mouth daily as needed for mild constipation.    . memantine (NAMENDA) 10 MG tablet Take 10 mg by mouth 2 (two) times daily.    . mirtazapine (REMERON) 7.5 MG tablet Take 7.5 mg by mouth at bedtime.    . pantoprazole (PROTONIX) 40 MG tablet Take 40 mg by mouth daily.    Marland Kitchen PRESCRIPTION MEDICATION Apply 0.5 mg topically every 12 (twelve) hours as needed (agitation). 23m/ ml lorazepam gel, apply topically to neck and wrist    . QUEtiapine (SEROQUEL XR) 200 MG 24 hr tablet Take 200 mg by mouth daily.    . traZODone (DESYREL) 50 MG tablet Take 12.5 mg by mouth 2 (two) times daily.      Musculoskeletal: Strength & Muscle Tone: within normal limits Gait & Station: unsteady Patient leans: N/A  Psychiatric Specialty Exam: Physical Exam  Constitutional: She  appears well-developed and well-nourished.  HENT:  Head: Normocephalic.  Neurological: She is alert. She has normal strength.  Skin: Skin is warm and dry.  Psychiatric: She has a normal mood and affect. Her speech is normal and behavior is normal. She expresses inappropriate judgment. She exhibits abnormal recent memory.    ROS  Blood pressure (!) 162/67, pulse 85, temperature 98.1 F (36.7 C), temperature source Oral, resp. rate 16, SpO2 100 %.There is no height or weight on file to calculate BMI.  General Appearance: Casual  Eye Contact:  Good  Speech:  Clear and Coherent  Volume:  Normal  Mood:  Euthymic  Affect:  Appropriate  Thought Process:  Coherent  Orientation:  Negative  Thought Content:  Illogical  Suicidal Thoughts:  No  Homicidal Thoughts:  No  Memory:  Recent;   Poor Remote;   Good  Judgement:  Impaired  Insight:  Lacking  Psychomotor Activity:  Normal  Concentration:  Concentration: Fair and Attention Span: Fair  Recall:  FBridgmanof Knowledge:  Good  Language:  Good  Akathisia:  No  Handed:  Right  AIMS (if indicated):     Assets:  Communication Skills Housing Physical Health Resilience Social Support  ADL's:  Impaired  Cognition:  Impaired,  Moderate  Sleep:        Treatment Plan Summary: Daily contact with patient to assess and evaluate symptoms and progress in treatment and Medication management (see MAR)  Disposition: Recommend psychiatric Inpatient admission when medically cleared. TTS to seek placement  LEthelene Hal NP 10/15/2017 11:31 AM   Patient has been evaluated by this MD,  note has been reviewed and I personally elaborated treatment  plan and recommendations.  JAmbrose Finland MD

## 2017-10-15 NOTE — ED Notes (Signed)
FAMILY MADE AWARE PT IS DECLINING TO TAKE MEDICATIONS THIS AM. HAD THIS CONVERSATION IN FRONT OF PT SO SHE IS AWARE OF EVENTS. PT DID NOT DENY EVENT

## 2017-10-15 NOTE — Progress Notes (Signed)
This patient continues to meet inpatient criteria. CSW fax information to the following facilities:   [facility]  [facility]  [facility]  [facility]  [facility]  [facility]  [facility]   Stacy GardnerErin Damin Salido, [place]WA Emergency Room Clinical Social Worker [phone]

## 2017-10-15 NOTE — ED Notes (Signed)
ATTEMPTED TO ASSIST PT TO BATHROOM. PT DECLINE TO WEAR YELLOW SOCKS STATING "THEY HURT HER TOES" FAMILY IS BRING HER BEDROOM SHOES HOWEVER HAS NOT DONE SO AT THIS TIME. PT STATES SHE WILL JUST SIT IN HER BED THEN AND WAIT UNTIL THEY COME.

## 2017-10-15 NOTE — ED Notes (Signed)
Pt.'s personal belongings endorsed to receiving Nurse, and locked in the [employer] #[address]. Pt.'s walker kept in pt.'s room, endorsed.

## 2017-10-15 NOTE — ED Notes (Signed)
TTS AT BEDSIDE 

## 2017-10-15 NOTE — ED Notes (Addendum)
PT GIVEN MEDICATION AND SPIT MEDICATIONS OUT. PT STATING "THESE MEDICATIONS ARE POISON AND I WON'T TAKE THEM NO MORE. JESUS CHRIST WILL BE THE ONE TAKING CARE OF ME".   NO AM MEDICATION TAKEN THIS AM PT DECLINE VITALS AT THIS TIME

## 2017-10-15 NOTE — ED Notes (Signed)
Bed: WA30 Expected date:  Expected time:  Means of arrival:  Comments: Room 4 

## 2017-10-16 DIAGNOSIS — R4182 Altered mental status, unspecified: Secondary | ICD-10-CM | POA: Diagnosis not present

## 2017-10-16 LAB — URINE CULTURE

## 2017-10-16 NOTE — ED Notes (Signed)
Pt stated "Why can't I have my chair?  My Jesus takes care of me.  You all don't need to take care of me."  Pt encouraged to stay in bed.

## 2017-10-16 NOTE — Progress Notes (Signed)
This patient continues to meet inpatient criteria. CSW fax information to the following facilities:   [facility]  [facility]  [facility]  [facility]  [facility]  [facility]  [facility]   Sherri Levenhagen, LCSWA Emergency Room Clinical Social Worker [phone]    

## 2017-10-16 NOTE — ED Notes (Signed)
Stretcher locked and in low position,Side rails upright. Call light given and within reach.  Comfort measures provided. °

## 2017-10-16 NOTE — ED Notes (Signed)
Pt is eating lunch at this time. NAD noted.

## 2017-10-16 NOTE — ED Notes (Addendum)
Pt refused all medications at this time. Pt ate breakfast, however was suspicious about meal and content of meds.

## 2017-10-16 NOTE — Progress Notes (Signed)
Per [place], [facility].  Princess BruinsAquicha Tamaya Pun, MSW, LCSW Therapeutic Triage Specialist  [phone]

## 2017-10-16 NOTE — BHH Counselor (Signed)
10/16/17:  Pt was reassessed this AM.  She reported that she is upset because there is poison all over her skin and that the nurses are giving her poison.  Pt continues to meet inpatient criteria.  From 10/15/17 notes:  Meagan Gomez is a 84 y.o. female patient who presents to the ED escorted by her family after [facility].  Family has complaints that patient had been refusing her medication because she thinks that her long term care facility is trying to poison her with her medication. This morning,patient confirms that the medication she has been taking has been making her feel sick and she is not going to take it. Patient calls her recent medications poison. Patient has history of Dementia. Patient family noted on admission that her irritation, recent aggressiveness, and paranoia are new and are unsure why she has had this change. Patient is calm and cooperative and feels that she is still in [place] nursing home in [place]. Patient denies any suicidal ideation, thoughts of hurting herself or others and feels safe at home. Patient denies any other concerns. Pt would benefit from an inpatient geriatric psychiatric admission for medication management and crisis stabilization.

## 2017-10-17 ENCOUNTER — Emergency Department (HOSPITAL_COMMUNITY): Payer: Medicare Other

## 2017-10-17 DIAGNOSIS — R4182 Altered mental status, unspecified: Secondary | ICD-10-CM | POA: Diagnosis not present

## 2017-10-17 DIAGNOSIS — Z9114 Patient's other noncompliance with medication regimen: Secondary | ICD-10-CM | POA: Diagnosis not present

## 2017-10-17 DIAGNOSIS — R4587 Impulsiveness: Secondary | ICD-10-CM

## 2017-10-17 DIAGNOSIS — F0391 Unspecified dementia with behavioral disturbance: Secondary | ICD-10-CM

## 2017-10-17 DIAGNOSIS — F03918 Unspecified dementia, unspecified severity, with other behavioral disturbance: Secondary | ICD-10-CM | POA: Diagnosis present

## 2017-10-17 NOTE — BH Assessment (Signed)
[facility]  Per Juanetta BeetsJacqueline Norman, DO, this pt requires psychiatric hospitalization at this time.  This patient is currently under voluntary status.  The following facilities have been contacted to seek placement for this pt, with results as noted:  Beds available, information sent, decision pending:  [facility]   At capacity:  [facility]   Doyl[facility] Canninghomas Ardean Melroy, [place] AutolivBehavioral Health Coordinator [phone]

## 2017-10-17 NOTE — ED Notes (Signed)
SBAR Report received from previous nurse. Pt received asleep and unable to participate in current SI/ HI, A/V H, depression, anxiety, or pain at this time, and appears otherwise stable and free of distress and rouses with minimal stimulation. Pt reminded of camera surveillance, q 15 min rounds, and rules of the milieu. Will continue to assess.

## 2017-10-17 NOTE — Consult Note (Addendum)
[facility] Face-to-Face Psychiatry Consult   Reason for Consult:  ED admission for personal care and memory concerns [facility] Patient Identification: Meagan Gomez MRN:  [id number] Principal Diagnosis: Dementia with behavioral disturbance Diagnosis:   Patient Active Problem List   Diagnosis Date Noted  . Dementia with behavioral disturbance [F03.91] 10/17/2017    Total Time spent with patient: 30 minutes  HPI:   Meagan Gomez is a 84 y.o. female patient admitted to the ED following [facility] by her family for assistance with new onset paranoia. Patient was brought in by her family (son and granddaughter) with complaints that patient has been refusing her medication at her long term care facility because she feels that the facility has been trying to kill her.  Patient states that the medication is making her feel sick and she is not going to take it. Patient is however taking medications her at this facility, and patient nurse states that she has been absolutely pleasant. Patient is still unable to verbalize where she is at and thinks that she is in [place]. This morning patient denies suicidal ideation, and thoughts of hurting herself or others. Patient states that she feels that the food she has eaten here has been "amazing" and denies any concerns. Pt would benefit from an inpatient geropsychiatric admission for medication management and crisis stabilization.   Past Psychiatric History:  Dementia  Risk to Self: Is patient at risk for suicide?: No Risk to Others:  No Prior Inpatient Therapy:  No Prior Outpatient Therapy:  No  Past Medical History:  Past Medical History:  Diagnosis Date  . Dementia    History reviewed. No pertinent surgical history. Family History: History reviewed. No pertinent family history. Family Psychiatric  History: Unknown Social History:  Social History   Substance and Sexual Activity  Alcohol Use Not on file      Social History   Substance and Sexual Activity  Drug Use Not on file    Social History   Socioeconomic History  . Marital status: Widowed    Spouse name: None  . Number of children: None  . Years of education: None  . Highest education level: None  Social Needs  . Financial resource strain: None  . Food insecurity - worry: None  . Food insecurity - inability: None  . Transportation needs - medical: None  . Transportation needs - non-medical: None  Occupational History  . None  Tobacco Use  . Smoking status: None  Substance and Sexual Activity  . Alcohol use: None  . Drug use: None  . Sexual activity: None  Other Topics Concern  . None  Social History Narrative  . None   Allergies:   Allergies  Allergen Reactions  . Aspirin Other (See Comments)    Unknown Reaction, on MAR  . Penicillins Other (See Comments)    Unknown reaction, on MAR    Labs: No results found for this or any previous visit (from the past 48 hour(s)).  Current Facility-Administered Medications  Medication Dose Route Frequency Provider Last Rate Last Dose  . acetaminophen (TYLENOL) tablet 1,000 mg  1,000 mg Oral Q6H PRN Ward, Chase PicketJaime Pilcher, PA-C      . alum & mag hydroxide-simeth (MAALOX/MYLANTA) 200-200-20 MG/5ML suspension 30 mL  30 mL Oral Daily PRN Ward, Chase PicketJaime Pilcher, PA-C   30 mL at 10/15/17 1816  . amLODipine (NORVASC) tablet 10 mg  10 mg Oral Daily Ward, Chase PicketJaime Pilcher, PA-C  10 mg at 10/17/17 1610  . aspirin EC tablet 81 mg  81 mg Oral Daily Ward, Chase Picket, PA-C   81 mg at 10/17/17 9604  . atorvastatin (LIPITOR) tablet 20 mg  20 mg Oral QHS Ward, Chase Picket, PA-C   20 mg at 10/16/17 2157  . carvedilol (COREG) tablet 6.25 mg  6.25 mg Oral BID WC Ward, Chase Picket, PA-C   6.25 mg at 10/17/17 5409  . cloNIDine (CATAPRES - Dosed in mg/24 hr) patch 0.1 mg  0.1 mg Transdermal Weekly Ward, Chase Picket, PA-C   0.1 mg at 10/15/17 1819  . docusate sodium (COLACE) capsule 100 mg  100 mg  Oral BID Ward, Chase Picket, PA-C   100 mg at 10/17/17 8119  . feeding supplement (ENSURE ENLIVE) (ENSURE ENLIVE) liquid 237 mL  237 mL Oral Daily Ward, Chase Picket, PA-C   237 mL at 10/17/17 0819  . guaiFENesin (ROBITUSSIN) 100 MG/5ML solution 200 mg  200 mg Oral QID PRN Ward, Chase Picket, PA-C      . hydrALAZINE (APRESOLINE) tablet 50 mg  50 mg Oral TID Ward, Chase Picket, PA-C   50 mg at 10/17/17 1478  . loratadine (CLARITIN) tablet 10 mg  10 mg Oral Daily Ward, Chase Picket, PA-C   10 mg at 10/17/17 2956  . magnesium hydroxide (MILK OF MAGNESIA) suspension 30 mL  30 mL Oral Daily PRN Ward, Chase Picket, PA-C      . memantine [facility]) tablet 10 mg  10 mg Oral BID Ward, Chase Picket, PA-C   10 mg at 10/17/17 2130  . mirtazapine (REMERON) tablet 7.5 mg  7.5 mg Oral QHS Ward, Chase Picket, PA-C   7.5 mg at 10/16/17 2156  . pantoprazole (PROTONIX) EC tablet 40 mg  40 mg Oral Daily Ward, Chase Picket, PA-C   40 mg at 10/17/17 8657  . QUEtiapine (SEROQUEL XR) 24 hr tablet 200 mg  200 mg Oral Daily Ward, Chase Picket, PA-C   200 mg at 10/17/17 8469  . traZODone (DESYREL) tablet 25 mg  25 mg Oral BID Ward, Chase Picket, PA-C   25 mg at 10/17/17 6295  . Vitamin D (Ergocalciferol) (DRISDOL) capsule 50,000 Units  50,000 Units Oral Weekly Ward, Chase Picket, PA-C       Current Outpatient Medications  Medication Sig Dispense Refill  . acetaminophen (TYLENOL) 500 MG tablet Take 1,000 mg by mouth every 6 (six) hours as needed for mild pain or moderate pain.    Marland Kitchen alum & mag hydroxide-simeth (MINTOX PLUS) 200-200-25 MG chewable tablet Chew 1 tablet by mouth daily as needed for indigestion.    Marland Kitchen amLODipine (NORVASC) 10 MG tablet Take 10 mg by mouth daily.    Marland Kitchen aspirin EC 81 MG tablet Take 81 mg by mouth daily.    Marland Kitchen atorvastatin (LIPITOR) 20 MG tablet Take 20 mg by mouth at bedtime.    . carvedilol (COREG) 6.25 MG tablet Take 6.25 mg by mouth 2 (two) times daily with a meal.    . cloNIDine  (CATAPRES - DOSED IN MG/24 HR) 0.1 mg/24hr patch Place 0.1 mg onto the skin once a week.    . cyanocobalamin (,VITAMIN B-12,) 1000 MCG/ML injection Inject 1,000 mcg into the muscle every 30 (thirty) days.    Marland Kitchen docusate sodium (COLACE) 100 MG capsule Take 100 mg by mouth 2 (two) times daily.    Marland Kitchen ENSURE (ENSURE) Take 237 mLs by mouth daily.    . ergocalciferol (VITAMIN D2) 50000 units capsule Take  50,000 Units by mouth once a week.    Marland Kitchen guaiFENesin (ROBITUSSIN) 100 MG/5ML liquid Take 200 mg by mouth 4 (four) times daily as needed for cough.    . hydrALAZINE (APRESOLINE) 50 MG tablet Take 50 mg by mouth 3 (three) times daily.    Marland Kitchen loratadine (CLARITIN) 10 MG tablet Take 10 mg by mouth daily.    . magnesium hydroxide (MILK OF MAGNESIA) 400 MG/5ML suspension Take 30 mLs by mouth daily as needed for mild constipation.    . memantine (NAMENDA) 10 MG tablet Take 10 mg by mouth 2 (two) times daily.    . mirtazapine (REMERON) 7.5 MG tablet Take 7.5 mg by mouth at bedtime.    . pantoprazole (PROTONIX) 40 MG tablet Take 40 mg by mouth daily.    Marland Kitchen PRESCRIPTION MEDICATION Apply 0.5 mg topically every 12 (twelve) hours as needed (agitation). 1mg / ml lorazepam gel, apply topically to neck and wrist    . QUEtiapine (SEROQUEL XR) 200 MG 24 hr tablet Take 200 mg by mouth daily.    . traZODone (DESYREL) 50 MG tablet Take 12.5 mg by mouth 2 (two) times daily.      Musculoskeletal: Strength & Muscle Tone: within normal limits Gait & Station: unsteady Patient leans: N/A  Psychiatric Specialty Exam: Physical Exam  Nursing note and vitals reviewed. Constitutional: She appears well-developed and well-nourished.  HENT:  Head: Normocephalic.  Neck: Normal range of motion.  Musculoskeletal: Normal range of motion.  Neurological: She is alert.  Skin: Skin is warm and dry.  Psychiatric: She has a normal mood and affect. Her speech is normal and behavior is normal. Thought content normal. Cognition and memory are  impaired. She expresses impulsivity.    Review of Systems  Psychiatric/Behavioral: Negative for suicidal ideas.  All other systems reviewed and are negative.   Blood pressure (!) 150/53, pulse 92, temperature 98.4 F (36.9 C), temperature source Oral, resp. rate 16, SpO2 97 %.There is no height or weight on file to calculate BMI.  General Appearance: Casual  Eye Contact:  Good  Speech:  Clear and Coherent  Volume:  Normal  Mood:  Euthymic  Affect:  Congruent  Thought Process:  Coherent  Orientation:  Negative  Thought Content:  Logical  Suicidal Thoughts:  No  Homicidal Thoughts:  No  Memory:  Immediate;   Poor Recent;   Fair Remote;   Good  Judgement:  Impaired  Insight:  Lacking  Psychomotor Activity:  Normal  Concentration:  Concentration: Fair and Attention Span: Fair  Recall:  [employer] of Knowledge:  Fair  Language:  Good  Akathisia:  No  Handed:  Right  AIMS (if indicated):   N/A  Assets:  Communication Skills Resilience Social Support  ADL's:  Intact  Cognition:  Impaired,  Moderate  Sleep:   N/A     Treatment Plan Summary: Daily contact with patient to assess and evaluate symptoms and progress in treatment and Medication management (see MAR)  Disposition: Recommend psychiatric Inpatient admission when medically cleared. TTS to seek placement  Laveda Abbe, NP 10/17/2017 3:29 PM   Patient seen face-to-face for psychiatric evaluation, chart reviewed and case discussed with the physician extender and developed treatment plan. Reviewed the information documented and agree with the treatment plan.  Juanetta Beets, DO 10/17/17 5:27 PM

## 2017-10-17 NOTE — ED Notes (Signed)
Pt lying in bed, eyes closed, appears to be sleeping. Respirations equal, breathing non labored, no s/s of distress noted. Sitter present. Will continue to monitor.

## 2017-10-18 DIAGNOSIS — R4182 Altered mental status, unspecified: Secondary | ICD-10-CM | POA: Diagnosis not present

## 2017-10-18 DIAGNOSIS — F0391 Unspecified dementia with behavioral disturbance: Secondary | ICD-10-CM | POA: Diagnosis not present

## 2017-10-18 DIAGNOSIS — R4587 Impulsiveness: Secondary | ICD-10-CM | POA: Diagnosis not present

## 2017-10-18 DIAGNOSIS — Z9114 Patient's other noncompliance with medication regimen: Secondary | ICD-10-CM | POA: Diagnosis not present

## 2017-10-18 NOTE — Consult Note (Addendum)
[facility] Face-to-Face Psychiatry Consult   Reason for Consult:   Referring Physician:  EDP Patient Identification: Meagan Gomez MRN:  [id number] Principal Diagnosis: Dementia with behavioral disturbance Diagnosis:   Patient Active Problem List   Diagnosis Date Noted  . Dementia with behavioral disturbance [F03.91] 10/17/2017    Total Time spent with patient: 30 minutes  HPI:   Meagan Gomez is a 84 y.o. female patient admitted to the ED after patient has been refusing her medication at her long term care facility because she felt that the facility had been trying to kill her. Patient states that the medication is making her feel sick and she is not going to take it. Patient is however taking medications at the hospital, and nurse states that she has been absolutely pleasant. Patient is still unable to verbalize where she is at and thinks that she is in [place]. This morning patient denies suicidal ideation, and thoughts of hurting herself or others. Patient denies any concerns. Pt would benefit from an inpatient geropsychiatric admission for medication management and crisis stabilization.   Past Psychiatric History: Dementia  Risk to Self: Is patient at risk for suicide?: No Risk to Others:  No Prior Inpatient Therapy:  No Prior Outpatient Therapy:  No  Past Medical History:  Past Medical History:  Diagnosis Date  . Dementia    History reviewed. No pertinent surgical history. Family History: History reviewed. No pertinent family history.  Social History:  Social History   Substance and Sexual Activity  Alcohol Use Not on file     Social History   Substance and Sexual Activity  Drug Use Not on file    Social History   Socioeconomic History  . Marital status: Widowed    Spouse name: None  . Number of children: None  . Years of education: None  . Highest education level: None  Social Needs  . Financial resource strain: None  . Food insecurity - worry: None   . Food insecurity - inability: None  . Transportation needs - medical: None  . Transportation needs - non-medical: None  Occupational History  . None  Tobacco Use  . Smoking status: None  Substance and Sexual Activity  . Alcohol use: None  . Drug use: None  . Sexual activity: None  Other Topics Concern  . None  Social History Narrative  . None   Allergies:   Allergies  Allergen Reactions  . Aspirin Other (See Comments)    Unknown Reaction, on MAR  . Penicillins Other (See Comments)    Unknown reaction, on MAR    Labs: No results found for this or any previous visit (from the past 48 hour(s)).  Current Facility-Administered Medications  Medication Dose Route Frequency Provider Last Rate Last Dose  . acetaminophen (TYLENOL) tablet 1,000 mg  1,000 mg Oral Q6H PRN Ward, Chase Picket, PA-C      . alum & mag hydroxide-simeth (MAALOX/MYLANTA) 200-200-20 MG/5ML suspension 30 mL  30 mL Oral Daily PRN Ward, Chase Picket, PA-C   30 mL at 10/18/17 0943  . amLODipine (NORVASC) tablet 10 mg  10 mg Oral Daily Ward, Chase Picket, PA-C   10 mg at 10/18/17 1028  . aspirin EC tablet 81 mg  81 mg Oral Daily Ward, Chase Picket, PA-C   81 mg at 10/18/17 1030  . atorvastatin (LIPITOR) tablet 20 mg  20 mg Oral QHS Ward, Chase Picket, PA-C   20 mg at 10/17/17 2158  . carvedilol (COREG) tablet 6.25 mg  6.25 mg Oral BID WC Ward, Chase Picket, PA-C   6.25 mg at 10/18/17 1030  . cloNIDine (CATAPRES - Dosed in mg/24 hr) patch 0.1 mg  0.1 mg Transdermal Weekly Ward, Chase Picket, PA-C   0.1 mg at 10/15/17 1819  . docusate sodium (COLACE) capsule 100 mg  100 mg Oral BID Ward, Chase Picket, PA-C   100 mg at 10/18/17 1028  . feeding supplement (ENSURE ENLIVE) (ENSURE ENLIVE) liquid 237 mL  237 mL Oral Daily Ward, Chase Picket, PA-C   237 mL at 10/18/17 1031  . guaiFENesin (ROBITUSSIN) 100 MG/5ML solution 200 mg  200 mg Oral QID PRN Ward, Chase Picket, PA-C      . hydrALAZINE (APRESOLINE) tablet 50  mg  50 mg Oral TID Ward, Chase Picket, PA-C   50 mg at 10/18/17 1030  . loratadine (CLARITIN) tablet 10 mg  10 mg Oral Daily Ward, Chase Picket, PA-C   10 mg at 10/18/17 1030  . magnesium hydroxide (MILK OF MAGNESIA) suspension 30 mL  30 mL Oral Daily PRN Ward, Chase Picket, PA-C      . memantine [facility]) tablet 10 mg  10 mg Oral BID Ward, Chase Picket, PA-C   10 mg at 10/18/17 1029  . mirtazapine (REMERON) tablet 7.5 mg  7.5 mg Oral QHS Ward, Chase Picket, PA-C   7.5 mg at 10/17/17 2158  . pantoprazole (PROTONIX) EC tablet 40 mg  40 mg Oral Daily Ward, Chase Picket, PA-C   40 mg at 10/18/17 1028  . QUEtiapine (SEROQUEL XR) 24 hr tablet 200 mg  200 mg Oral Daily Ward, Chase Picket, PA-C   200 mg at 10/18/17 1029  . traZODone (DESYREL) tablet 25 mg  25 mg Oral BID Ward, Chase Picket, PA-C   25 mg at 10/18/17 1028  . Vitamin D (Ergocalciferol) (DRISDOL) capsule 50,000 Units  50,000 Units Oral Weekly Ward, Chase Picket, PA-C       Current Outpatient Medications  Medication Sig Dispense Refill  . acetaminophen (TYLENOL) 500 MG tablet Take 1,000 mg by mouth every 6 (six) hours as needed for mild pain or moderate pain.    Marland Kitchen alum & mag hydroxide-simeth (MINTOX PLUS) 200-200-25 MG chewable tablet Chew 1 tablet by mouth daily as needed for indigestion.    Marland Kitchen amLODipine (NORVASC) 10 MG tablet Take 10 mg by mouth daily.    Marland Kitchen aspirin EC 81 MG tablet Take 81 mg by mouth daily.    Marland Kitchen atorvastatin (LIPITOR) 20 MG tablet Take 20 mg by mouth at bedtime.    . carvedilol (COREG) 6.25 MG tablet Take 6.25 mg by mouth 2 (two) times daily with a meal.    . cloNIDine (CATAPRES - DOSED IN MG/24 HR) 0.1 mg/24hr patch Place 0.1 mg onto the skin once a week.    . cyanocobalamin (,VITAMIN B-12,) 1000 MCG/ML injection Inject 1,000 mcg into the muscle every 30 (thirty) days.    Marland Kitchen docusate sodium (COLACE) 100 MG capsule Take 100 mg by mouth 2 (two) times daily.    Marland Kitchen ENSURE (ENSURE) Take 237 mLs by mouth daily.    .  ergocalciferol (VITAMIN D2) 50000 units capsule Take 50,000 Units by mouth once a week.    Marland Kitchen guaiFENesin (ROBITUSSIN) 100 MG/5ML liquid Take 200 mg by mouth 4 (four) times daily as needed for cough.    . hydrALAZINE (APRESOLINE) 50 MG tablet Take 50 mg by mouth 3 (three) times daily.    Marland Kitchen loratadine (CLARITIN) 10 MG tablet Take 10 mg  by mouth daily.    . magnesium hydroxide (MILK OF MAGNESIA) 400 MG/5ML suspension Take 30 mLs by mouth daily as needed for mild constipation.    . memantine (NAMENDA) 10 MG tablet Take 10 mg by mouth 2 (two) times daily.    . mirtazapine (REMERON) 7.5 MG tablet Take 7.5 mg by mouth at bedtime.    . pantoprazole (PROTONIX) 40 MG tablet Take 40 mg by mouth daily.    Marland Kitchen. PRESCRIPTION MEDICATION Apply 0.5 mg topically every 12 (twelve) hours as needed (agitation). 1mg / ml lorazepam gel, apply topically to neck and wrist    . QUEtiapine (SEROQUEL XR) 200 MG 24 hr tablet Take 200 mg by mouth daily.    . traZODone (DESYREL) 50 MG tablet Take 12.5 mg by mouth 2 (two) times daily.      Musculoskeletal: Strength & Muscle Tone: within normal limits Gait & Station: unsteady Patient leans: N/A  Psychiatric Specialty Exam: Physical Exam  Nursing note and vitals reviewed. Constitutional: She appears well-developed and well-nourished.  HENT:  Head: Normocephalic.  Neck: Normal range of motion.  Respiratory: Effort normal.  Musculoskeletal: Normal range of motion.  Neurological: She is alert.  Skin: Skin is warm and dry.  Psychiatric: She has a normal mood and affect. Her speech is normal and behavior is normal. Judgment and thought content normal. Cognition and memory are impaired.    Review of Systems  Psychiatric/Behavioral: Negative for suicidal ideas.  All other systems reviewed and are negative.   Blood pressure (!) 130/43, pulse 86, temperature 98.1 F (36.7 C), temperature source Oral, resp. rate 16, SpO2 98 %.There is no height or weight on file to calculate  BMI.  General Appearance: Casual  Eye Contact:  Good  Speech:  Clear and Coherent  Volume:  Decreased  Mood:  Euthymic  Affect:  Appropriate and Congruent  Thought Process:  Coherent  Orientation:  Negative  Thought Content:  Logical  Suicidal Thoughts:  No  Homicidal Thoughts:  No  Memory:  Immediate;   Fair Recent;   Fair Remote;   Fair  Judgement:  Impaired  Insight:  Lacking  Psychomotor Activity:  Normal  Concentration:  Concentration: Fair  Recall:  [employer]  Fund of Knowledge:  Fair  Language:  Good  Akathisia:  No  Handed:  Right  AIMS (if indicated):   N/A  Assets:  ArchitectCommunication Skills Financial Resources/Insurance Housing Physical Health Resilience  ADL's:  Intact  Cognition:  Impaired,  Mild  Sleep:   N/A     Treatment Plan Summary: Daily contact with patient to assess and evaluate symptoms and progress in treatment and Medication management (see MAR)  Disposition: Recommend psychiatric Inpatient admission when medically cleared. TTS to seek placement  Laveda AbbeLaurie Britton Parks, NP 10/18/2017 11:14 AM   Patient seen face-to-face for psychiatric evaluation, chart reviewed and case discussed with the physician extender and developed treatment plan. Reviewed the information documented and agree with the treatment plan.  Juanetta BeetsJacqueline Sharlynn Seckinger, DO 10/18/17 1:05 PM

## 2017-10-18 NOTE — ED Notes (Signed)
[facility] is [facility] [employer] [facility]

## 2017-10-18 NOTE — ED Notes (Signed)
ED Provider at bedside.  Psych team.  Pt seems pleasantly confused

## 2017-10-18 NOTE — ED Notes (Signed)
Gave report to Drummondmelissa at [facility]

## 2017-10-18 NOTE — ED Notes (Signed)
Called sheriff [place], pick up about 630pm tonight

## 2017-10-18 NOTE — Progress Notes (Addendum)
CSW spoke with patients granddaughter, Gwen PoundsBrittany Bladwin [phone], regarding current disposition and long-term plans. Patient currently lives at [facility] [place] however family has realized that patient needs additional care/ memory care. [facility] Medicaid benefits and daughter states patient/ family is unable to private pay for a facility. Family has been seeking a new facility(Memory Care/ or SNF with locked unit) within the [facility] area that is able to accept Medicaid.   Daughter states patients son lives in [place] and has looked into sending patient to a [facility] [place], that takes Medicaid, daughter understands that patient has [place] Medicaid and does not transfer state-to-state.   Daughter voices understanding with current recommendation for gero-psych placement and also voices understanding that patient may need to return to current [facility] in [place]/ home with family in the event that patient no longer meets criteria for gero- psych placement. Daughter/ family are continuing to seek new placement for patient within the community.   CSW provided daughter with contact for Eugenio HoesEllen Williams RN liaison with multiple ALF/Memory care communities in the area.   Stacy GardnerErin Heather Streeper, [facility] Emergency Room Clinical Social Worker [phone]

## 2017-10-18 NOTE — ED Notes (Signed)
Attempted to call report to [facility], left message on vm

## 2017-10-18 NOTE — BH Assessment (Signed)
[facility] Assessment Progress Note  Per Juanetta BeetsJacqueline Norman, DO, this pt continues to require psychiatric hospitalization at this time.  Dr Sharma CovertNorman also finds that pt meets criteria for IVC, which she has initiated.  [place] [facility], and at 12:48 Hortense RamalMagistrate Watts confirms receipt.  As of this writing, service of Findings and Custody Order is pending.  At 13:36 Efraim KaufmannMelissa calls from [facility] to report that pt has been accepted to their facility by Dr Joseph ArtSubedi.  Laveda AbbeLaurie Britton Parks, FNP concurs with this decision.  Pt's nurse, Marchelle Folksmanda, has been notified, and agrees to call report to [phone].  [facility] [place] [facility].  Doylene Canninghomas Kala [facility] Coordinator [phone]

## 2017-10-18 NOTE — ED Notes (Signed)
DON Delfin [facility]ant[facility] [phone] fax number Needs to fax H & P meds Notes from visit here

## 2017-10-18 NOTE — Progress Notes (Signed)
CSW notified patients daughter, Lowanda FosterBrittany [phone], that patient has been accepted to [facility].   Stacy GardnerErin Mirage Pfefferkorn, [facility] Emergency Room Clinical Social Worker [phone]

## 2017-10-18 NOTE — ED Notes (Signed)
Meagan Gomez picked up patient to transfer [facility] to [facility]

## 2019-01-12 IMAGING — CT CT HEAD W/O CM
3 series · 16 of 47 positions shown, 19 images · non-contrast
Comparison: CT brain 09/20/2017

CLINICAL DATA: Increased aggression and confusion

EXAM:
CT HEAD WITHOUT CONTRAST
TECHNIQUE: Contiguous axial images were obtained from the base of the skull
through the vertex without intravenous contrast.

[Series 2: head wo · axial · 0.41mm/px · z∈[+1700,+1825]mm · 10 of 31 slices shown, 13 images]
[im 3/31  brain]
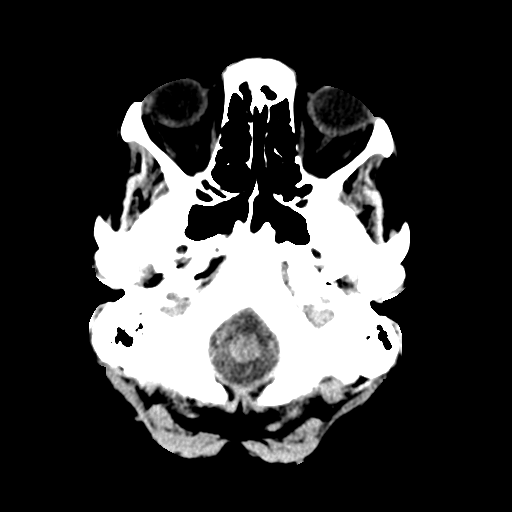
[im 3/31  bone]
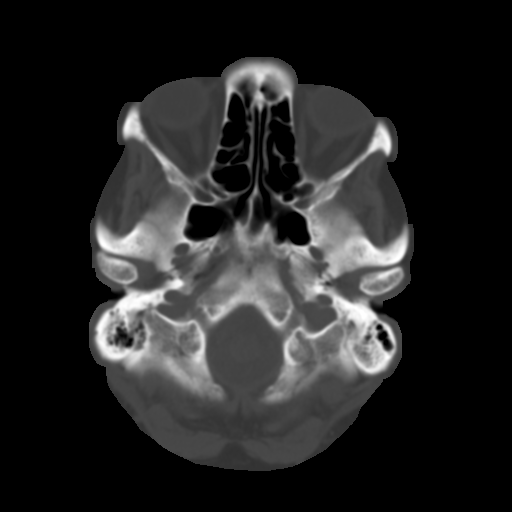
[im 6/31  brain]
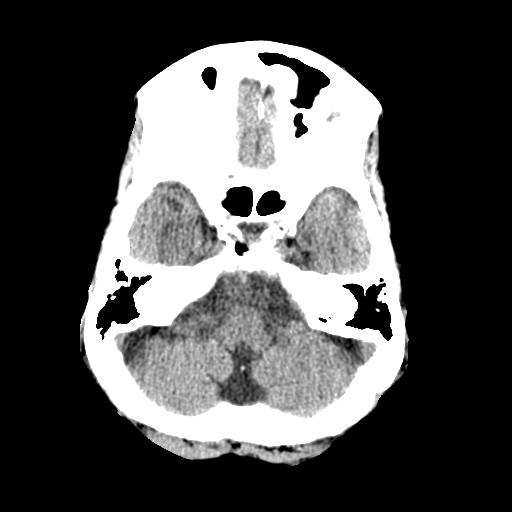
[im 9/31  brain]
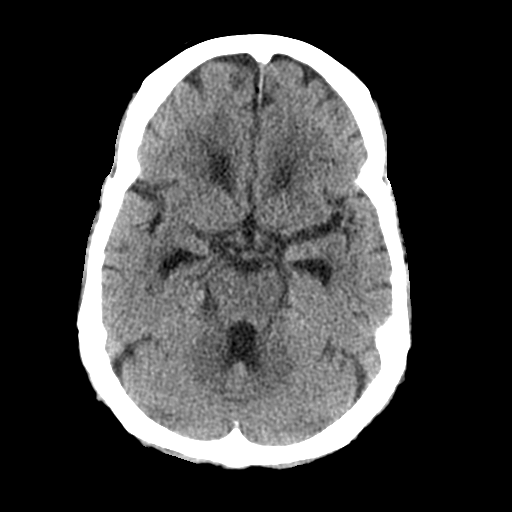
[im 11/31  brain]
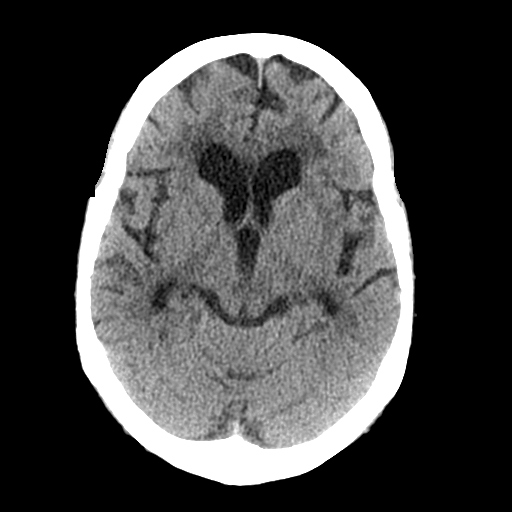
[im 14/31  brain]
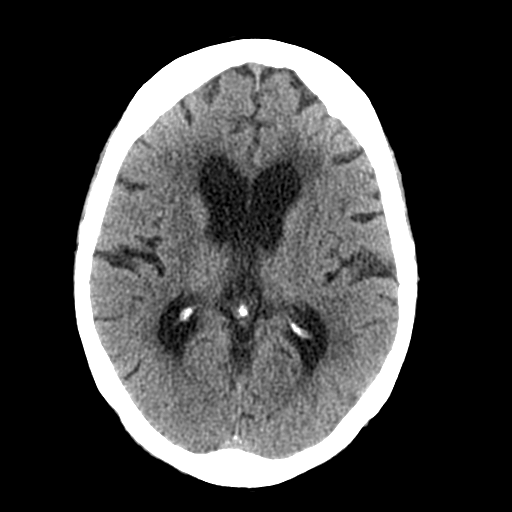
[im 14/31  bone]
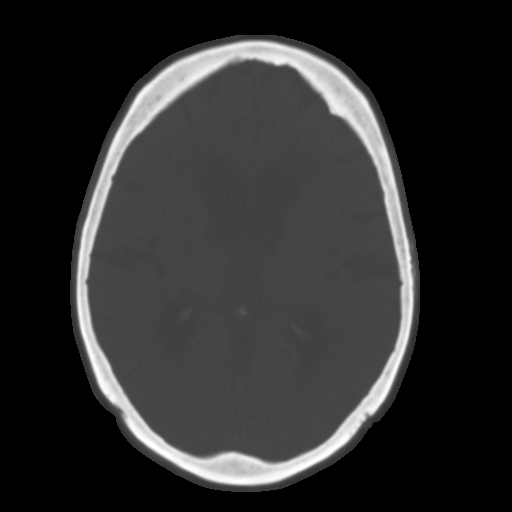
[im 17/31  brain]
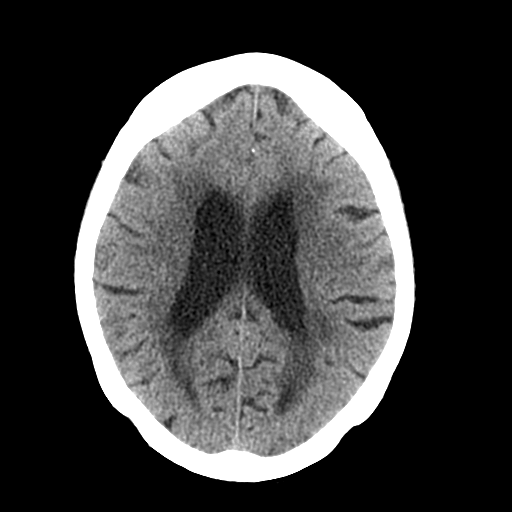
[im 20/31  brain]
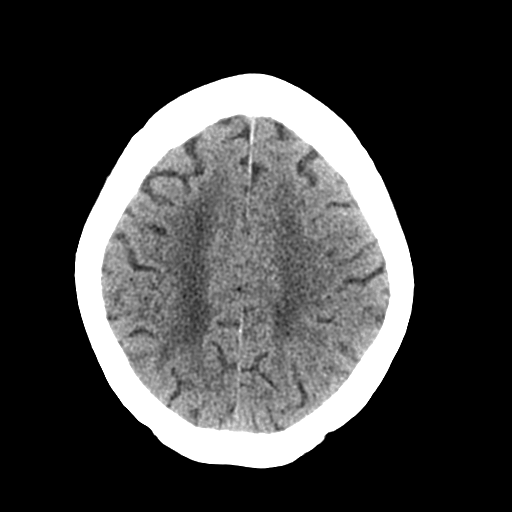
[im 23/31  brain]
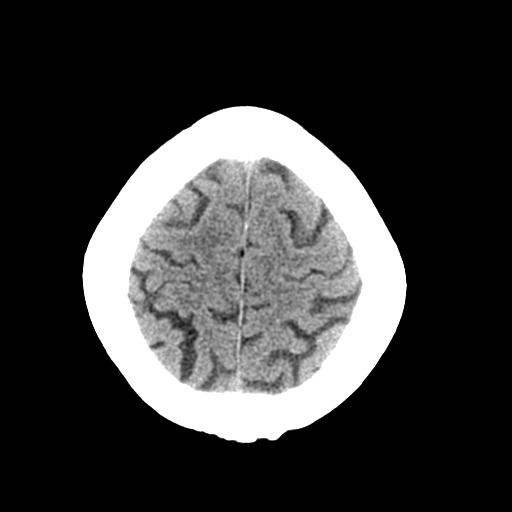
[im 25/31  brain]
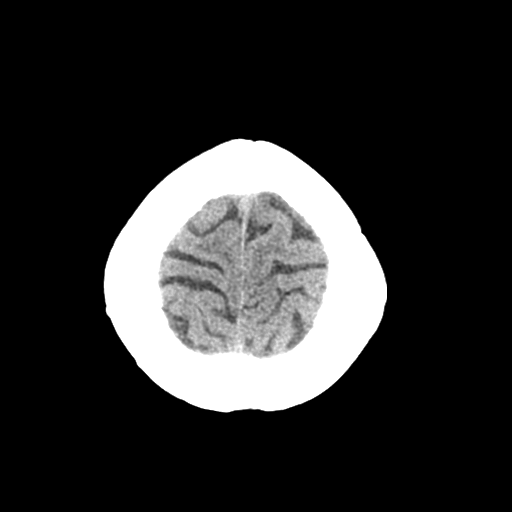
[im 25/31  bone]
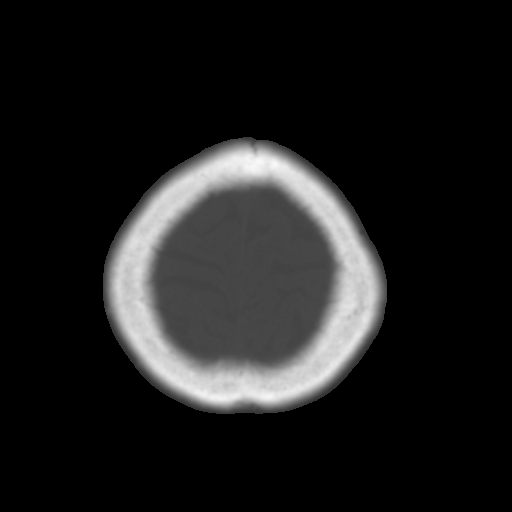
[im 28/31  brain]
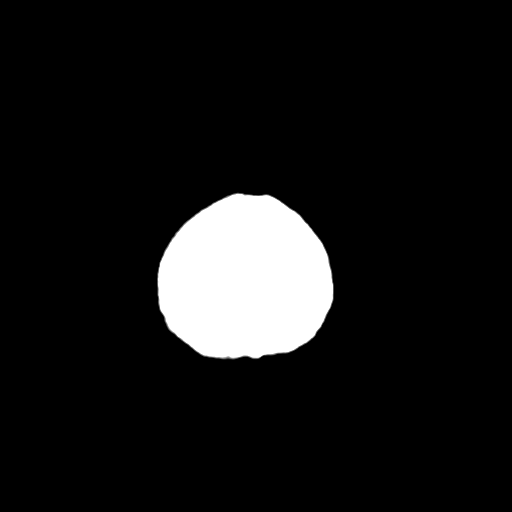

[Series 5: coronal soft tissue · coronal · 0.30mm/px · 3 of 72 slices shown]
[im 24/72  brain]
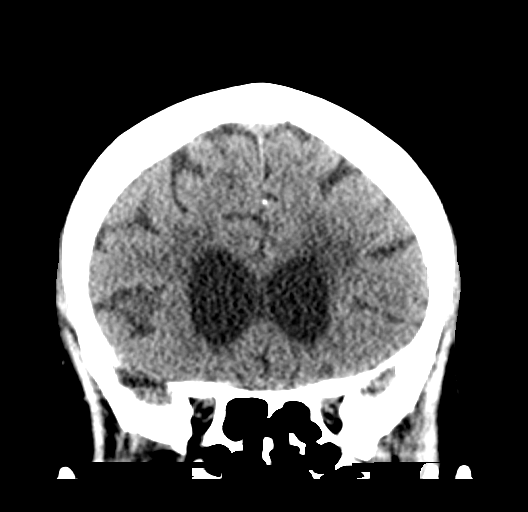
[im 32/72  brain]
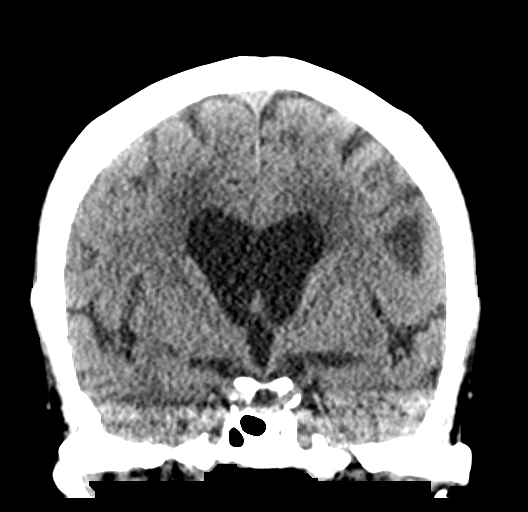
[im 40/72  brain]
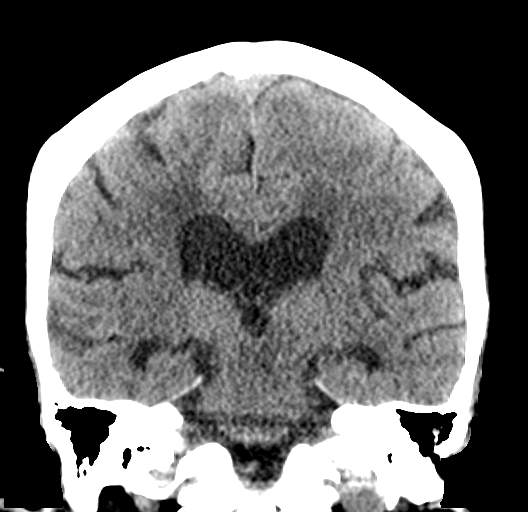

[Series 6: sagittal soft tissue · sagittal · 0.30mm/px · 3 of 54 slices shown]
[im 18/54  brain]
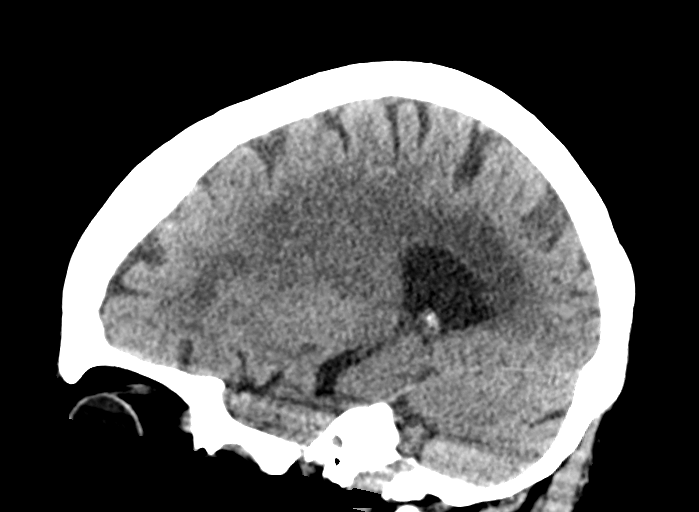
[im 27/54  brain]
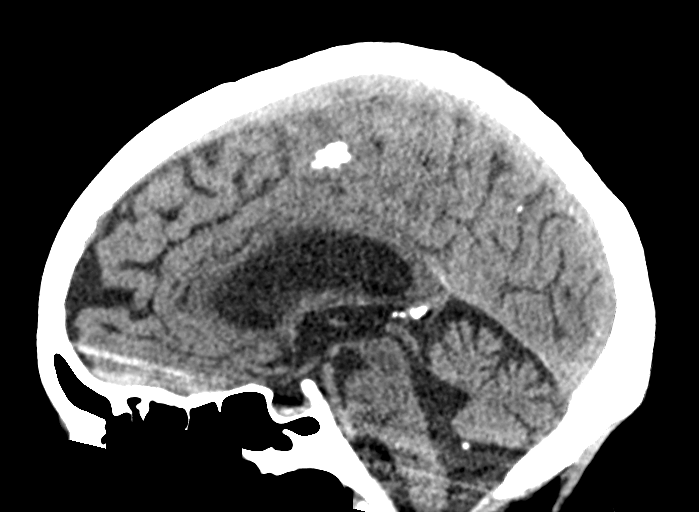
[im 36/54  brain]
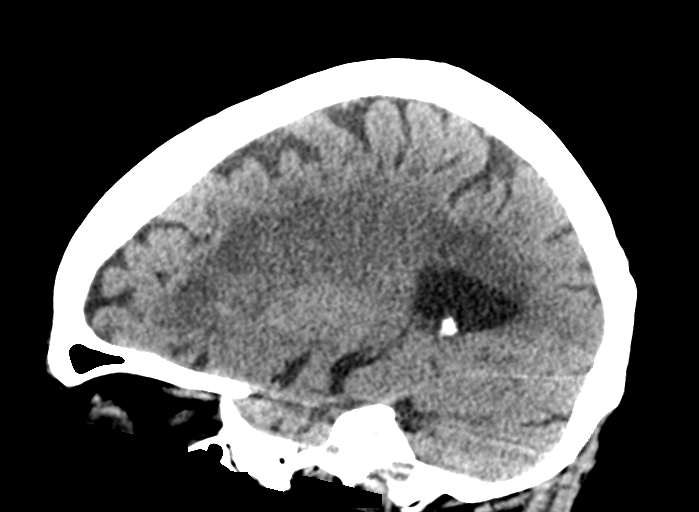

[16 of 47 positions shown; findings below may reference images not displayed]

FINDINGS: Brain: No acute territorial infarction, hemorrhage, or intracranial
mass is visualized. Mild to moderate atrophy. Moderate small vessel
ischemic changes of the white matter. Stable ventricle size.

Vascular: No hyperdense vessels.  Carotid vascular calcification

Skull: Normal. Negative for fracture or focal lesion.

Sinuses/Orbits: No acute finding.

Other: None
IMPRESSION: 1. No CT evidence for acute intracranial abnormality.
2. Atrophy and small vessel ischemic changes of the white matter

## 2019-09-17 DEATH — deceased
